# Patient Record
Sex: Male | Born: 1960 | Race: Black or African American | Hispanic: No | Marital: Single
Health system: Southern US, Community
[De-identification: ages and names within clinical notes are randomized; demographics above are authoritative.]

## PROBLEM LIST (undated history)

## (undated) DIAGNOSIS — I10 Essential (primary) hypertension: Secondary | ICD-10-CM

---

## 2017-02-17 ENCOUNTER — Encounter (HOSPITAL_COMMUNITY): Payer: Self-pay | Admitting: Emergency Medicine

## 2017-02-17 ENCOUNTER — Inpatient Hospital Stay (HOSPITAL_COMMUNITY)
Admission: EM | Admit: 2017-02-17 | Discharge: 2017-02-18 | DRG: 064 | Disposition: A | Discharge status: Other Acute Inpatient Hospital | Payer: Medicaid Other | Attending: Neurology | Admitting: Neurology

## 2017-02-17 ENCOUNTER — Emergency Department (HOSPITAL_COMMUNITY): Payer: Medicaid Other

## 2017-02-17 DIAGNOSIS — G8191 Hemiplegia, unspecified affecting right dominant side: Secondary | ICD-10-CM | POA: Diagnosis not present

## 2017-02-17 DIAGNOSIS — I610 Nontraumatic intracerebral hemorrhage in hemisphere, subcortical: Principal | ICD-10-CM | POA: Diagnosis present

## 2017-02-17 DIAGNOSIS — I161 Hypertensive emergency: Secondary | ICD-10-CM | POA: Diagnosis not present

## 2017-02-17 DIAGNOSIS — R4701 Aphasia: Secondary | ICD-10-CM | POA: Diagnosis not present

## 2017-02-17 DIAGNOSIS — N179 Acute kidney failure, unspecified: Secondary | ICD-10-CM | POA: Diagnosis not present

## 2017-02-17 DIAGNOSIS — G936 Cerebral edema: Secondary | ICD-10-CM | POA: Diagnosis present

## 2017-02-17 DIAGNOSIS — R4182 Altered mental status, unspecified: Secondary | ICD-10-CM | POA: Diagnosis present

## 2017-02-17 DIAGNOSIS — I10 Essential (primary) hypertension: Secondary | ICD-10-CM | POA: Diagnosis present

## 2017-02-17 DIAGNOSIS — I619 Nontraumatic intracerebral hemorrhage, unspecified: Secondary | ICD-10-CM | POA: Diagnosis present

## 2017-02-17 DIAGNOSIS — Z9114 Patient's other noncompliance with medication regimen: Secondary | ICD-10-CM

## 2017-02-17 DIAGNOSIS — I629 Nontraumatic intracranial hemorrhage, unspecified: Secondary | ICD-10-CM

## 2017-02-17 HISTORY — DX: Essential (primary) hypertension: I10

## 2017-02-17 LAB — PROTIME-INR
INR: 1.24
PROTHROMBIN TIME: 15.5 s — AB (ref 11.4–15.2)

## 2017-02-17 LAB — I-STAT TROPONIN, ED: Troponin i, poc: 0.06 ng/mL (ref 0.00–0.08)

## 2017-02-17 LAB — CBC
HCT: 20.9 % — ABNORMAL LOW (ref 39.0–52.0)
Hemoglobin: 7.2 g/dL — ABNORMAL LOW (ref 13.0–17.0)
MCH: 37.5 pg — ABNORMAL HIGH (ref 26.0–34.0)
MCHC: 34.4 g/dL (ref 30.0–36.0)
MCV: 108.9 fL — ABNORMAL HIGH (ref 78.0–100.0)
PLATELETS: 176 10*3/uL (ref 150–400)
RBC: 1.92 MIL/uL — AB (ref 4.22–5.81)
RDW: 20.7 % — ABNORMAL HIGH (ref 11.5–15.5)
WBC: 8.7 10*3/uL (ref 4.0–10.5)

## 2017-02-17 LAB — I-STAT CHEM 8, ED
BUN: 30 mg/dL — ABNORMAL HIGH (ref 6–20)
CHLORIDE: 112 mmol/L — AB (ref 101–111)
Calcium, Ion: 1.16 mmol/L (ref 1.15–1.40)
Creatinine, Ser: 3.4 mg/dL — ABNORMAL HIGH (ref 0.61–1.24)
GLUCOSE: 105 mg/dL — AB (ref 65–99)
HEMATOCRIT: 23 % — AB (ref 39.0–52.0)
Hemoglobin: 7.8 g/dL — ABNORMAL LOW (ref 13.0–17.0)
POTASSIUM: 4.7 mmol/L (ref 3.5–5.1)
Sodium: 137 mmol/L (ref 135–145)
TCO2: 16 mmol/L — ABNORMAL LOW (ref 22–32)

## 2017-02-17 LAB — COMPREHENSIVE METABOLIC PANEL
ALBUMIN: 3 g/dL — AB (ref 3.5–5.0)
ALK PHOS: 207 U/L — AB (ref 38–126)
ALT: 18 U/L (ref 17–63)
ANION GAP: 12 (ref 5–15)
AST: 56 U/L — ABNORMAL HIGH (ref 15–41)
BUN: 30 mg/dL — ABNORMAL HIGH (ref 6–20)
CALCIUM: 8.2 mg/dL — AB (ref 8.9–10.3)
CHLORIDE: 110 mmol/L (ref 101–111)
CO2: 13 mmol/L — AB (ref 22–32)
Creatinine, Ser: 3.24 mg/dL — ABNORMAL HIGH (ref 0.61–1.24)
GFR calc Af Amer: 24 mL/min — ABNORMAL LOW (ref >60)
GFR calc non Af Amer: 20 mL/min — ABNORMAL LOW (ref >60)
GLUCOSE: 107 mg/dL — AB (ref 65–99)
Potassium: 4.6 mmol/L (ref 3.5–5.1)
SODIUM: 135 mmol/L (ref 135–145)
Total Bilirubin: 0.8 mg/dL (ref 0.3–1.2)
Total Protein: 6.4 g/dL — ABNORMAL LOW (ref 6.5–8.1)

## 2017-02-17 LAB — DIFFERENTIAL
Basophils Absolute: 0 10*3/uL (ref 0.0–0.1)
Basophils Relative: 0 %
EOS PCT: 1 %
Eosinophils Absolute: 0.1 10*3/uL (ref 0.0–0.7)
LYMPHS PCT: 34 %
Lymphs Abs: 3 10*3/uL (ref 0.7–4.0)
Monocytes Absolute: 0.4 10*3/uL (ref 0.1–1.0)
Monocytes Relative: 4 %
NEUTROS ABS: 5.3 10*3/uL (ref 1.7–7.7)
NEUTROS PCT: 61 %

## 2017-02-17 LAB — TROPONIN I: TROPONIN I: 0.04 ng/mL — AB (ref <0.03)

## 2017-02-17 LAB — APTT: aPTT: 35 seconds (ref 24–36)

## 2017-02-17 LAB — CBG MONITORING, ED: GLUCOSE-CAPILLARY: 93 mg/dL (ref 65–99)

## 2017-02-17 MED ORDER — IOPAMIDOL (ISOVUE-370) INJECTION 76%
INTRAVENOUS | Status: AC
  Filled 2017-02-17: qty 50

## 2017-02-17 MED ORDER — ACETAMINOPHEN 160 MG/5ML PO SOLN: 650.0000 mg | ORAL | Status: DC | PRN

## 2017-02-17 MED ORDER — SENNOSIDES-DOCUSATE SODIUM 8.6-50 MG PO TABS
1.0000 | ORAL_TABLET | Freq: Two times a day (BID) | ORAL | Status: DC
  Filled 2017-02-17: qty 1

## 2017-02-17 MED ORDER — ACETAMINOPHEN 650 MG RE SUPP: 650.0000 mg | RECTAL | Status: DC | PRN

## 2017-02-17 MED ORDER — LABETALOL HCL 5 MG/ML IV SOLN
10.0000 mg | INTRAVENOUS | Status: AC
  Administered 2017-02-17 (×2): 10 mg via INTRAVENOUS

## 2017-02-17 MED ORDER — ACETAMINOPHEN 325 MG PO TABS: 650.0000 mg | ORAL_TABLET | ORAL | Status: DC | PRN

## 2017-02-17 MED ORDER — PANTOPRAZOLE SODIUM 40 MG IV SOLR
40.0000 mg | Freq: Every day | INTRAVENOUS | Status: DC
  Administered 2017-02-17: 40 mg via INTRAVENOUS
  Filled 2017-02-17: qty 40

## 2017-02-17 MED ORDER — STROKE: EARLY STAGES OF RECOVERY BOOK
Freq: Once | Status: DC
  Administered 2017-02-17: 20:00:00
  Filled 2017-02-17: qty 1

## 2017-02-17 MED ORDER — LABETALOL HCL 5 MG/ML IV SOLN
INTRAVENOUS | Status: AC
  Filled 2017-02-17: qty 4

## 2017-02-17 MED ORDER — NICARDIPINE HCL IN NACL 20-0.86 MG/200ML-% IV SOLN
0.0000 mg/h | INTRAVENOUS | Status: DC
  Administered 2017-02-17 (×2): 5 mg/h via INTRAVENOUS
  Filled 2017-02-17: qty 400
  Filled 2017-02-17: qty 200

## 2017-02-17 NOTE — ED Triage Notes (Signed)
Pt BIB EMS. LSN 1800 today when pt arrived at his sister's house. Per EMS, family noted pt becoming altered/"no himself" and he attempted to stand from the chair he was in, only to fall back immediately. Pt presents with flacid R sided weakness to arm and leg, R side gaze, limited verbal response, and mild confusion. Known hx of HTN, which family reports poor compliance with meds. Initial CBG of 69 for EMS, for which he received half an amp of D50. CBG rose to 141 en route.

## 2017-02-17 NOTE — Progress Notes (Signed)
Patient ID: Randy GaussCharles Kolden, male   DOB: 01/07/1961, 56 y.o.   MRN: [id number]  I was asked to review a CT of a patient with a dominant hemisphere basal ganglia hemorrhage for possible surgical evacuation.  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  Patient is currently awake, aphasic and hemiplegic.  Recommend maximizing medical treatment with strict BP management and possibly 3%NS.

## 2017-02-17 NOTE — ED Notes (Addendum)
Pt sister called. Unable to come to hospital d/t lack of transportation. States pt has medical records with [facility]. Individual able to confirm hx of HTN, doesn't take his BP meds often, and had a previous "small stroke a couple years ago". Individual states that the pt "drinks a lot" and smokes regularly. Unable to give additional information.

## 2017-02-17 NOTE — ED Provider Notes (Signed)
[facility]-EMERGENCY DEPT Provider Note   CSN: [id number] Arrival date & time: 02/17/17  1912     History   Chief Complaint No chief complaint on file.   HPI Randy Guerrero is a 56 y.o. male.  Patient is a 56yo M with only known PMH of HTN noncompliant on medications who presents with R sided weakness. History via EMS since patient is nonverbal. Level V caveat. Patient was last seen normal around 6pm today. Was reportedly at sister's house, tried to stand but fell backwards and noted to have R sided weakness. Brought in by EMS as code stroke with elevated BP to 200/130s     Per EMS, has h/o HTN noncompliant on medications. No other PMH known. No past medical history on file.   Patient Active Problem List   Diagnosis Date Noted  . ICH (intracerebral hemorrhage) (HCC) 02/17/2017    No past surgical history on file.     Home Medications    Prior to Admission medications   Not on File    Family History No family history on file.  Social History Social History  Substance Use Topics  . Smoking status: Not on file  . Smokeless tobacco: Not on file  . Alcohol use Not on file  Patient denies EtOH, tobacco use, recreational drugs   Allergies   Patient has no allergy information on record.   Review of Systems Review of Systems  Unable to perform ROS: Patient nonverbal (Level V caveat)     Physical Exam Updated Vital Signs BP (!) 177/113   Pulse 87   Resp 18   SpO2 91%   Physical Exam  Constitutional: He appears well-developed and well-nourished. No distress.  HENT:  Head: Normocephalic and atraumatic.  Nose: Nose normal.  Mouth/Throat: Oropharynx is clear and moist.  Eyes: Pupils are equal, round, and reactive to light. Conjunctivae are normal.  L sided gaze, unable to look towards R side  Neck: Neck supple.  Cardiovascular: Normal rate, regular rhythm, normal heart sounds and intact distal pulses.   No murmur heard. Pulmonary/Chest: Effort normal and  breath sounds normal. No respiratory distress. He has no wheezes.  Abdominal: Soft. Bowel sounds are normal. He exhibits no distension. There is no tenderness. There is no rebound and no guarding.  Musculoskeletal: He exhibits edema.  Lymphadenopathy:    He has no cervical adenopathy.  Neurological: He is alert.  Oriented to self and place. Unable to assess if oriented to time. R sided neglect. 0/5 strength on R UE and LE. DTRs 2+. Unable to assess whether sensation intact.   Skin: Capillary refill takes less than 2 seconds.     ED Treatments / Results  Labs (all labs ordered are listed, but only abnormal results are displayed) Labs Reviewed  CBC - Abnormal; Notable for the following:       Result Value   RBC 1.92 (*)    Hemoglobin 7.2 (*)    HCT 20.9 (*)    MCV 108.9 (*)    MCH 37.5 (*)    RDW 20.7 (*)    All other components within normal limits  I-STAT CHEM 8, ED - Abnormal; Notable for the following:    Chloride 112 (*)    BUN 30 (*)    Creatinine, Ser 3.40 (*)    Glucose, Bld 105 (*)    TCO2 16 (*)    Hemoglobin 7.8 (*)    HCT 23.0 (*)    All other components within normal limits  DIFFERENTIAL  PROTIME-INR  APTT  COMPREHENSIVE METABOLIC PANEL  HIV ANTIBODY (ROUTINE TESTING)  I-STAT TROPONIN, ED  CBG MONITORING, ED    EKG  EKG Interpretation None       Radiology Ct Head Code Stroke Wo Contrast  Result Date: 02/17/2017 CLINICAL DATA:  Code stroke.  56 y/o  M; right-sided weakness. EXAM: CT HEAD WITHOUT CONTRAST TECHNIQUE: Contiguous axial images were obtained from the base of the skull through the vertex without intravenous contrast. COMPARISON:  None. FINDINGS: Brain: Acute hemorrhage centered in the left lateral basal ganglia measuring 6.4 x 3.2 x 5.0 cm (volume = 54 cm^3) (AP x ML x CC series 3, image 17 and series 5, image 34). Surrounding hypoattenuation is compatible with vasogenic edema. Local mass effect results in sulcal effacement in the surrounding  cerebral hemispheres, left lateral ventricle effacement, and 5 mm of left-to-right midline shift. No downward herniation. Additionally there is increased density within the left caudate head which may represent petechial hemorrhage. Vascular: No hyperdense vessel or unexpected calcification. Skull: Normal. Negative for fracture or focal lesion. Sinuses/Orbits: Mild left maxillary sinus mucosal thickening and mucous retention cyst. Otherwise negative. Other: None. ASPECTS [facility] Stroke Program Early CT Score) - Ganglionic level infarction (caudate, lentiform nuclei, internal capsule, insula, M1-M3 cortex): 7 - Supraganglionic infarction (M4-M6 cortex): 3 Total score (0-10 with 10 being normal): 10 IMPRESSION: 1. Large acute hemorrhage in the left lateral basal ganglia, 54 cc. 2. Mass effect with effacement of left lateral ventricle and 5 mm left-to-right midline shift. No downward herniation. 3. Possible petechial hemorrhage in left caudate head. 4. ASPECTS is 10 These results were called by telephone at the time of interpretation on 02/17/2017 at 7:32 pm to Dr. Gerhard Munch , who verbally acknowledged these results. Electronically Signed   By: Mitzi Hansen M.D.   On: 02/17/2017 19:33    Procedures Procedures (including critical care time)  Medications Ordered in ED Medications  labetalol (NORMODYNE,TRANDATE) injection 10 mg (not administered)  iopamidol (ISOVUE-370) 76 % injection (not administered)  labetalol (NORMODYNE,TRANDATE) 5 MG/ML injection (not administered)   stroke: mapping our early stages of recovery book (not administered)  acetaminophen (TYLENOL) tablet 650 mg (not administered)    Or  acetaminophen (TYLENOL) solution 650 mg (not administered)    Or  acetaminophen (TYLENOL) suppository 650 mg (not administered)  senna-docusate (Senokot-S) tablet 1 tablet (not administered)  pantoprazole (PROTONIX) injection 40 mg (not administered)  nicardipine (CARDENE) 20mg  in  0.86% saline IV infusion (0.1 mg/ml) (not administered)     Initial Impression / Assessment and Plan / ED Course  I have reviewed the triage vital signs and the nursing notes.  Pertinent labs & imaging results that were available during my care of the patient were reviewed by me and considered in my medical decision making (see chart for details).   Patient is a 56yo M with only known PMH of HTN noncompliant on home medications, no further history obtained d/t patient nonverbal status and no family at bedside. He presented to the ED as code stroke after being found to have R sided weakness with last known normal around 6pm today. Found to have a large acute hemorrhage in L lateral basal ganglia that correlates with R sided neglect on physical exam. Has elevated BP that is being managed by neurology with a nicardipine gtt. Does have an elevated creatinine with unknown baseline. Patient is admitted to ICU under neurology Dr. Laurence Slate service.  Final Clinical Impressions(s) / ED Diagnoses   Final diagnoses:  Intracranial hemorrhage [facility])    New Prescriptions New Prescriptions   No medications on file     Leland Her, DO 02/17/17 1952    Gerhard Munch, MD 02/17/17 2351

## 2017-02-17 NOTE — Progress Notes (Signed)
Pt arrived on unit at 2300. Report taken from Rushie Goltz RN.

## 2017-02-18 ENCOUNTER — Inpatient Hospital Stay (HOSPITAL_COMMUNITY): Payer: Medicaid Other

## 2017-02-18 DIAGNOSIS — I629 Nontraumatic intracranial hemorrhage, unspecified: Secondary | ICD-10-CM

## 2017-02-18 LAB — HIV ANTIBODY (ROUTINE TESTING W REFLEX): HIV SCREEN 4TH GENERATION: NONREACTIVE

## 2017-02-18 LAB — MRSA PCR SCREENING: MRSA by PCR: NEGATIVE

## 2017-02-18 NOTE — Progress Notes (Signed)
[facility]. [facility]side [facility] and pt officially left unit at [employer]

## 2017-02-18 NOTE — Discharge Summary (Signed)
Date of admission: 02/17/2017 Date of discharge: 02/17/2017   HPI:  Randy Guerrero is a 56 y.o. male  the past medical history of hypertension, prior CVA who presents with sudden onset aphasia and right hemiparesis around 6 PM was witnessed by his sister. [facility] room as a stroke alert, blood pressure was 200 systolic on arrival. According to family the patient is noncompliant with hypertensive medications. He is not on any antiplatelets or anticoagulants. CT head showed a large left basal ganglia hemorrhage.   Hospital Course:   Patient was admitted to the neurological ICU. He was started on Cardene drip and blood pressures have remained less than 140/90. The patient remained stable neurologically. Consulted neurosurgery who felt there was no role for neurosurgical intervention in the dominant hemisphere intracerebral hemorrhage, even if the patient did decline clinically. The patient does have greater than 40 mL subcortical hemorrhage mass effect and midline shift and may be a candidate for microinvasive evacuation procedures with some preliminary studies supporting early evacuation. I discussed this case with neurosurgery attending  at [facility] who accepted the patient for possible neurosurgical intervention. I spoke with the family ( both sisters) who agreed with the transfer.  Left basal ganglia hemorrhage  Cytotoxic cerebral edema Midline shift 5 mm Elevated creatinine on arrival, unknown if patient has CKD Hypertensive emergency with chronic uncontrolled hypertension   #Left basal ganglia hemorrhage IC score 2 50 cc associated with cytotoxic edema and midline shift. No herniation seen on initial CT. Started patient on Cardene drip BP goal of less than 140 systolic Platelets, PT/INR and APTT within normal limits. CTA head and was not performed due to a kidney disease and location of hemorrhage favors hypertensive etiology Repeat CT head in 4  hours Low suspicion to intubate decline in mental status, GCS is currently 11 Neurosurgery was consulted, felt patient was not a candidate for any neurosurgical intervention even if he declines.  #AKI, possibly with CKD Avoid nephrotoxic agents  #Hypertensive emergency Continue Micardis pain drip with goal of less than 140 systolic    CLINICAL DATA:  Follow-up stroke.  History of hypertension.  EXAM: CT HEAD WITHOUT CONTRAST  TECHNIQUE: Contiguous axial images were obtained from the base of the skull through the vertex without intravenous contrast.  COMPARISON:  CT HEAD February 17, 2017  FINDINGS: BRAIN: Similar large LEFT basal ganglia/subinsular 6.5 x 2.9 cm intraparenchymal hematoma involving LEFT temporal stem. Similar surrounding low-density vasogenic edema. 5 mm LEFT-to-RIGHT midline shift. Partially effaced LEFT lateral ventricle without entrapment. No acute large vascular territory infarct. Patchy white matter changes most compatible chronic small vessel ischemic disease exclusive of the aforementioned abnormality. No abnormal extra-axial fluid collections.  VASCULAR: Trace calcific atherosclerosis.  SKULL/SOFT TISSUES: No skull fracture. No significant soft tissue swelling.  ORBITS/SINUSES: The included ocular globes and orbital contents are normal.Mild LEFT maxillary sinus mucosal thickening. subcentimeter RIGHT anterior ethmoid osteoma. Mastoid air cells are well aerated.  OTHER: None.  IMPRESSION: 1. Evolving large LEFT basal ganglia/subinsular hematoma, similar 5 mm LEFT-to-RIGHT midline shift. No ventricular entrapment. 2. Mild to moderate white matter changes compatible chronic small vessel ischemic disease.  CLINICAL DATA:  Code stroke.  56 y/o  M; right-sided weakness.  EXAM: CT HEAD WITHOUT CONTRAST  TECHNIQUE: Contiguous axial images were obtained from the base of the skull through the vertex without intravenous  contrast.  COMPARISON:  None.  FINDINGS: Brain: Acute hemorrhage centered in the left lateral basal ganglia measuring 6.4 x 3.2  x 5.0 cm (volume = 54 cm^3) (AP x ML x CC series 3, image 17 and series 5, image 34). Surrounding hypoattenuation is compatible with vasogenic edema. Local mass effect results in sulcal effacement in the surrounding cerebral hemispheres, left lateral ventricle effacement, and 5 mm of left-to-right midline shift. No downward herniation. Additionally there is increased density within the left caudate head which may represent petechial hemorrhage.  Vascular: No hyperdense vessel or unexpected calcification.  Skull: Normal. Negative for fracture or focal lesion.  Sinuses/Orbits: Mild left maxillary sinus mucosal thickening and mucous retention cyst. Otherwise negative.  Other: None.  ASPECTS [facility] Stroke Program Early CT Score)  - Ganglionic level infarction (caudate, lentiform nuclei, internal capsule, insula, M1-M3 cortex): 7  - Supraganglionic infarction (M4-M6 cortex): 3  Total score (0-10 with 10 being normal): 10  IMPRESSION: 1. Large acute hemorrhage in the left lateral basal ganglia, 54 cc. 2. Mass effect with effacement of left lateral ventricle and 5 mm left-to-right midline shift. No downward herniation. 3. Possible petechial hemorrhage in left caudate head. 4. ASPECTS is 10 These results were called by telephone at the time of interpretation on 02/17/2017 at 7:32 pm to Dr. Gerhard Munch , who verbally acknowledged these results.

## 2017-02-18 NOTE — H&P (Addendum)
Neurology H&P  CC: Right hemiparesis and aphasia  History is obtained from: EMS and family  HPI: Randy Guerrero is a 56 y.o. male  the past medical history of hypertension, prior CVA who presents with sudden onset aphasia and right hemiparesis around 6 PM was witnessed by his sister. He presented to [facility] emergency room as a stroke alert, blood pressure was 200 systolic on arrival. According to family the patient is noncompliant with hypertensive medications. He is not on any antiplatelets or anticoagulants.   LKW: 6pm  ICH Score: 2 Modified Rankin Score at baseline 0  ZOX:[id number] to obtain due to altered mental status.   Past Medical History:  Diagnosis Date  . Hypertension      No family history on file.   Social History:  has no tobacco, alcohol, and drug history on file.   Exam: Current vital signs: BP 130/82   Pulse 94   Temp 98.4 F (36.9 C) (Oral)   Resp 20   Ht 5\' 6"  (1.676 m)   Wt 67.5 kg (148 lb 13 oz)   SpO2 98%   BMI 24.02 kg/m  Vital signs in last 24 hours: Temp:  [98.4 F (36.9 C)] 98.4 F (36.9 C) (08/30 0000) Pulse Rate:  [86-94] 94 (08/29 2305) Resp:  [16-25] 20 (08/29 2305) BP: (125-199)/(78-130) 130/82 (08/29 2305) SpO2:  [91 %-98 %] 98 % (08/29 2305) FiO2 (%):  [0 %] 0 % (08/29 1922) Weight:  [67.5 kg (148 lb 13 oz)-67.6 kg (149 lb)] 67.5 kg (148 lb 13 oz) (08/30 0015)  Physical Exam  Constitutional: Appears well-developed and well-nourished.  Psych: Affect appropriate to situation Eyes: No scleral injection HENT: No OP obstrucion Head: Normocephalic.  Cardiovascular: Normal rate and regular rhythm.  Respiratory: Effort normal and breath sounds normal to anterior ascultation GI: Soft.  No distension. There is no tenderness.  Skin: WDI  Neuro: Mental Status: Patient Is alert, follows some commands intermittently. Aphasic Cranial Nerves: II: Visual Fields are full. Pupils are equal, round, and reactive to light.  Left  hemi-neglect. III,IV, VI: EOMI without ptosis or diploplia. No forced gaze deviation V: Facial sensation is symmetric to temperature VII: Facial movement is symmetric.  VIII: hearing is intact to voice X: Uvula elevates symmetrically XI: Shoulder shrug is symmetric. XII: tongue is midline without atrophy or fasciculations.  Motor: Tone is normal. Bulk is normal. 5/5 strength was present over left upper and lower extremity. 1/5 strength over right upper and lower extremity Sensory: Unable to clearly assess but less withdrawal to noxious stimulus over right hemibody Deep Tendon Reflexes: 2+ and symmetric in the biceps and patellae.  Plantars: Toes are downgoing bilaterally.  Cerebellar: No no obvious ataxia noted   Assessment plan  Randy Guerrero is a 56 y.o. male  the past medical history of hypertension, prior CVA who presents with sudden onset aphasia and right hemiparesis around 6 PM was witnessed by his sister. He presented to [facility] emergency room as a stroke alert, blood pressure was 200 systolic on arrival. CT head showed a large left basal ganglia hemorrhage    Left basal ganglia hemorrhage  Vasogenic cerebral edema Midline shift 5 mm Elevated creatinine on arrival, unknown if patient has CKD Hypertensive emergency with chronic uncontrolled hypertension   #Left basal ganglia hemorrhage IC score 2 50 cc associated with vaosogenic edema and midline shift. No herniation seen on initial CT. Started patient on Cardene drip BP goal of less than 140 systolic Platelets, PT/INR and APTT  within normal limits. CTA head and was not performed due to a kidney disease and location of hemorrhage favors hypertensive etiology Repeat CT head in 4 hours Low suspicion to intubate decline in mental status, GCS is currently 11 Neurosurgery was consulted, felt patient was not a candidate for any neurosurgical intervention even if he declines.  #AKI, possibly with CKD Avoid nephrotoxic  agents  #Hypertensive emergency Continue Micardis pain drip with goal of less than 140 systolic  DVT prophylaxis SCDs NPO     Sushanth Aroor MD [facility] [phone]  If 7pm to 7am, please call on call as listed on AMION.

## 2018-09-29 IMAGING — CT CT HEAD CODE STROKE
4 series · 16 of 47 positions shown, 18 images · non-contrast
Comparison: None.

CLINICAL DATA: Code stroke.  55 y/o  M; right-sided weakness.

EXAM:
CT HEAD WITHOUT CONTRAST
TECHNIQUE: Contiguous axial images were obtained from the base of the skull
through the vertex without intravenous contrast.

[Series 3: head wo · axial · 0.40mm/px · z∈[-56,+60]mm · 7 of 31 slices shown, 9 images]
[im 4/31  brain]
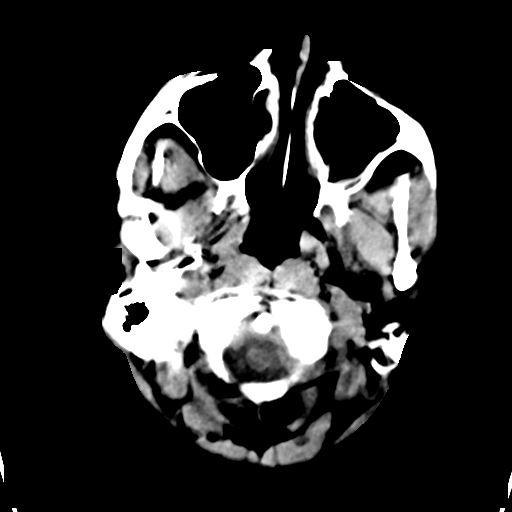
[im 4/31  bone]
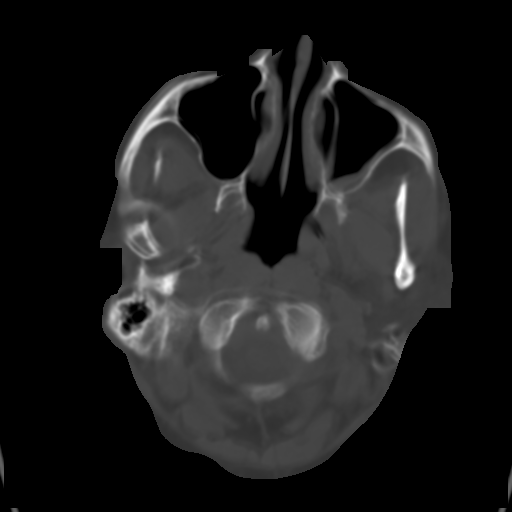
[im 8/31  brain]
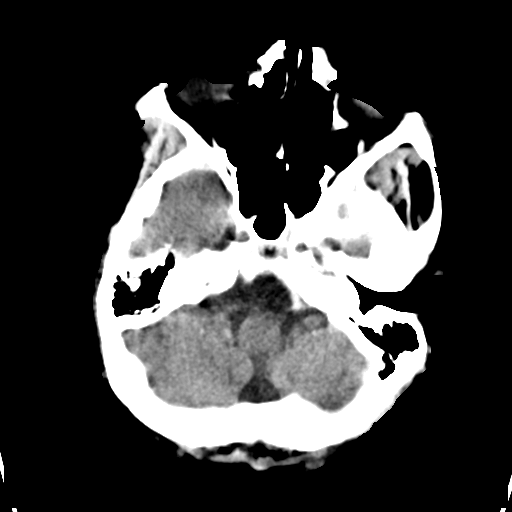
[im 12/31  brain]
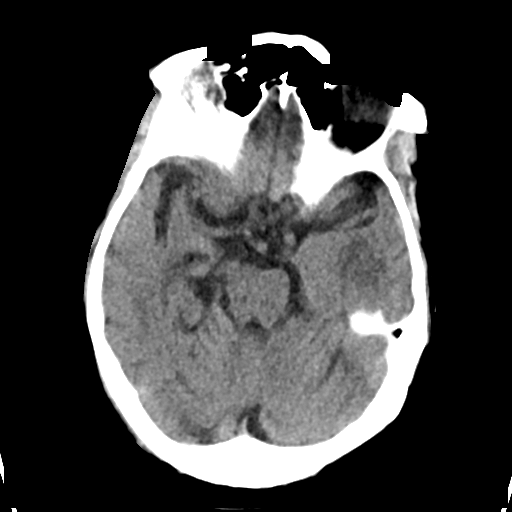
[im 16/31  brain]
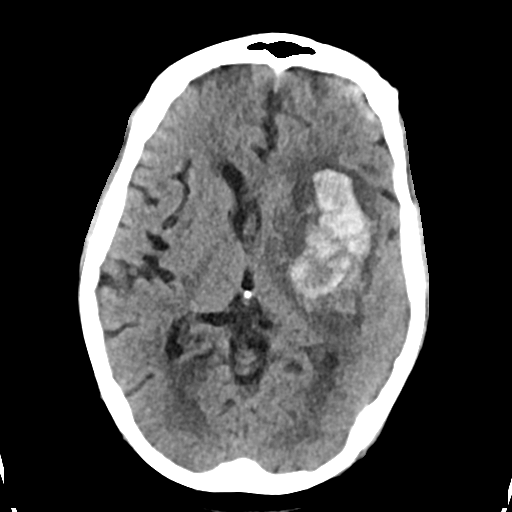
[im 19/31  brain]
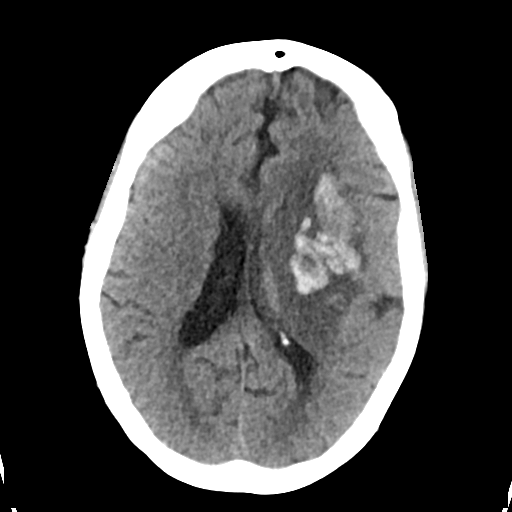
[im 19/31  bone]
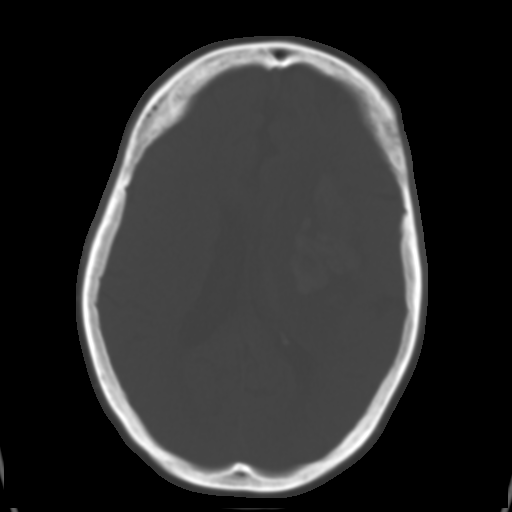
[im 23/31  brain]
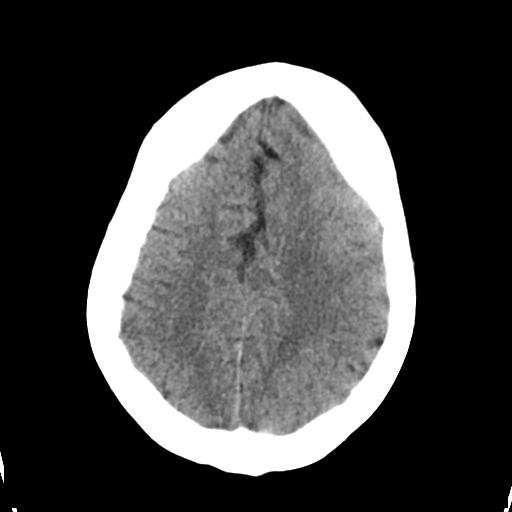
[im 27/31  brain]
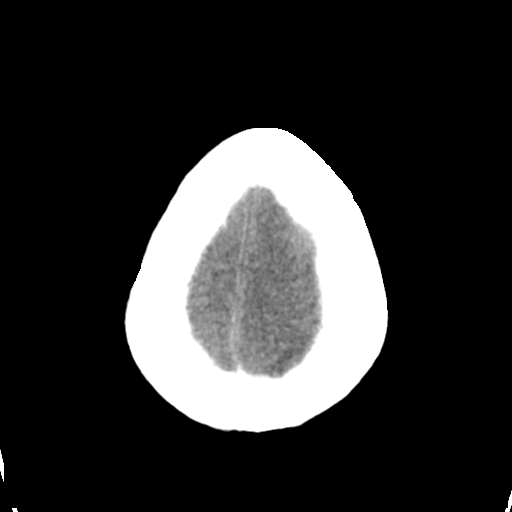

[Series 4: head bone · axial · 0.40mm/px · z∈[-56,-24]mm · 3 of 78 slices shown]
[im 8/78  bone]
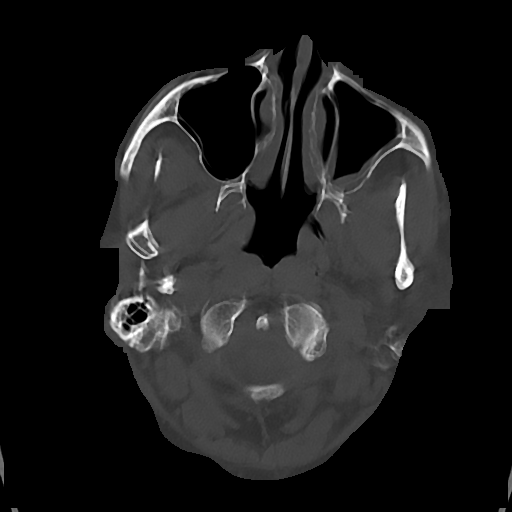
[im 16/78  bone]
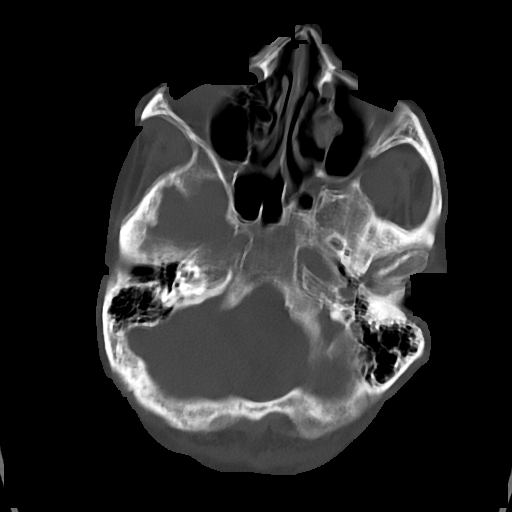
[im 24/78  bone]
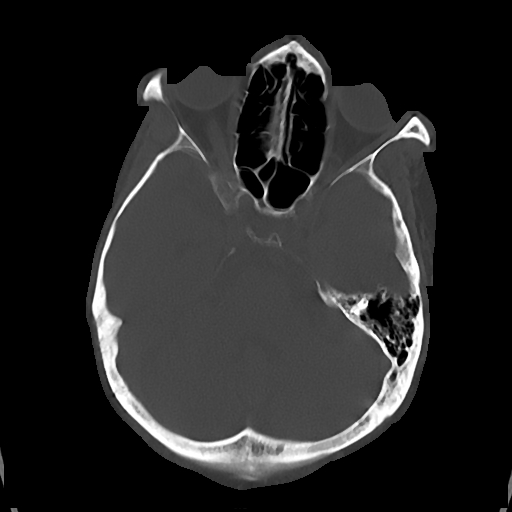

[Series 5: cor soft · coronal · 0.30mm/px · 3 of 67 slices shown]
[im 23/67  brain]
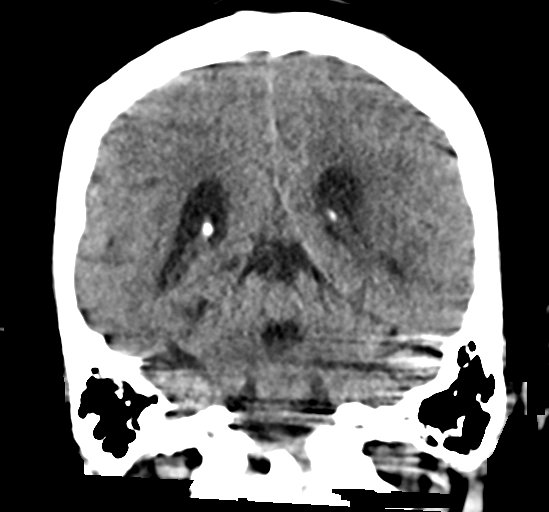
[im 30/67  brain]
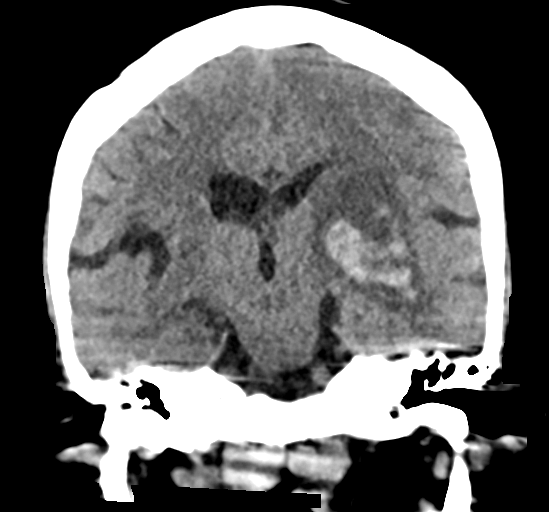
[im 37/67  brain]
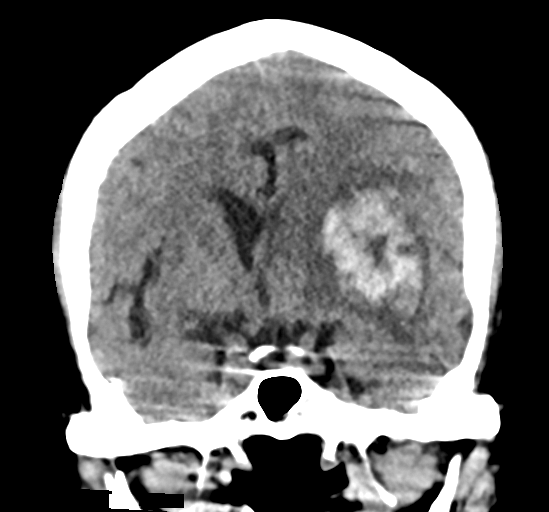

[Series 6: sag soft · sagittal · 0.30mm/px · 3 of 54 slices shown]
[im 18/54  brain]
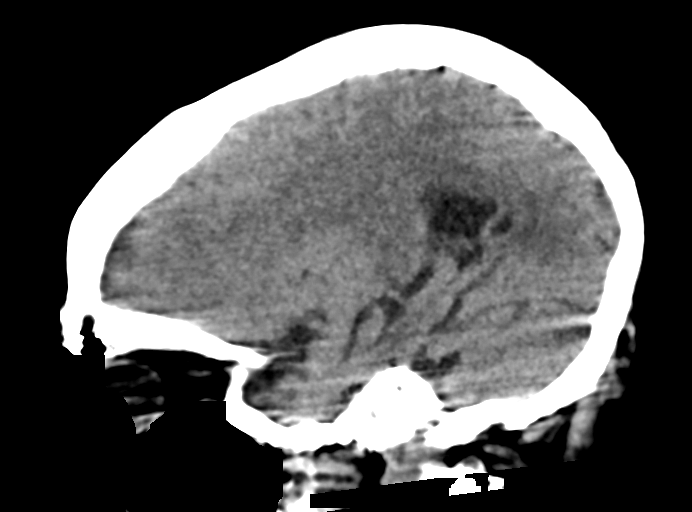
[im 27/54  brain]
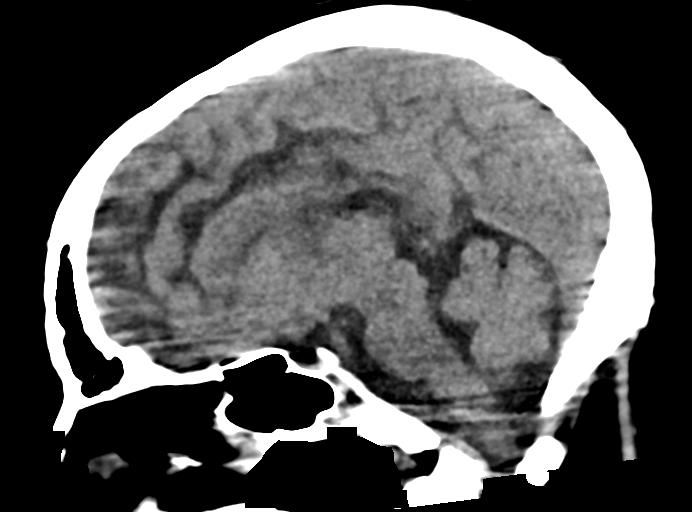
[im 36/54  brain]
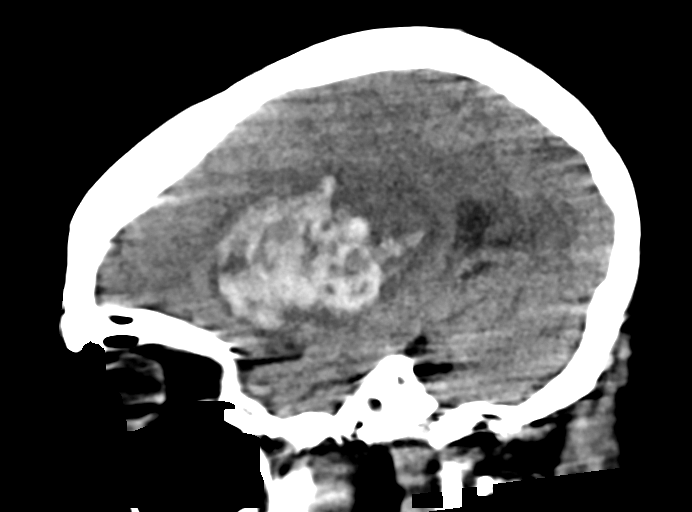

[16 of 47 positions shown; findings below may reference images not displayed]

FINDINGS: Brain: Acute hemorrhage centered in the left lateral basal ganglia
measuring 6.4 x 3.2 x 5.0 cm (volume = 54 cm^3) (AP x ML x CC
series 3, image 17 and series 5, image 34). Surrounding
hypoattenuation is compatible with vasogenic edema. Local mass
effect results in sulcal effacement in the surrounding cerebral
hemispheres, left lateral ventricle effacement, and 5 mm of
left-to-right midline shift. No downward herniation. Additionally
there is increased density within the left caudate head which may
represent petechial hemorrhage.

Vascular: No hyperdense vessel or unexpected calcification.

Skull: Normal. Negative for fracture or focal lesion.

Sinuses/Orbits: Mild left maxillary sinus mucosal thickening and
mucous retention cyst. Otherwise negative.

Other: None.

ASPECTS ([facility] Stroke Program Early CT Score)

- Ganglionic level infarction (caudate, lentiform nuclei, internal
capsule, insula, M1-M3 cortex): 7

- Supraganglionic infarction (M4-M6 cortex): 3

Total score (0-10 with 10 being normal): 10
IMPRESSION: 1. Large acute hemorrhage in the left lateral basal ganglia, 54 cc.
2. Mass effect with effacement of left lateral ventricle and 5 mm
left-to-right midline shift. No downward herniation.
3. Possible petechial hemorrhage in left caudate head.
4. ASPECTS is 10
These results were called by telephone at the time of interpretation
on 02/17/2017 at [DATE] to Dr. FRESIA MONTESINOS , who verbally
acknowledged these results.

By: Kayy Turgoose M.D.

## 2018-09-30 IMAGING — CT CT HEAD W/O CM
4 series · 15 of 47 positions shown, 17 images · non-contrast
Comparison: CT HEAD February 17, 2017

CLINICAL DATA: Follow-up stroke.  History of hypertension.

EXAM:
CT HEAD WITHOUT CONTRAST
TECHNIQUE: Contiguous axial images were obtained from the base of the skull
through the vertex without intravenous contrast.

[Series 3: head wo · axial · 0.44mm/px · z∈[-74,+40]mm · 7 of 31 slices shown, 9 images]
[im 4/31  brain]
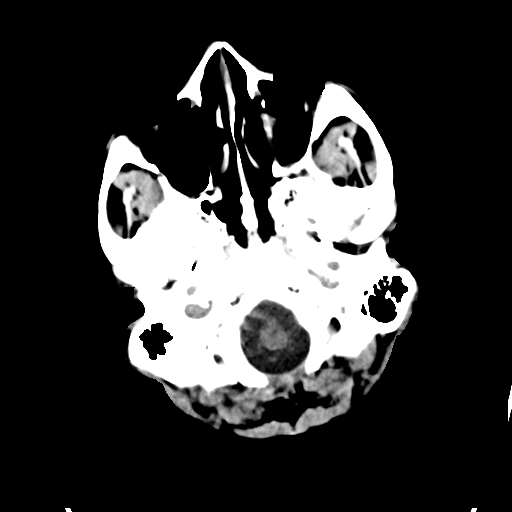
[im 4/31  bone]
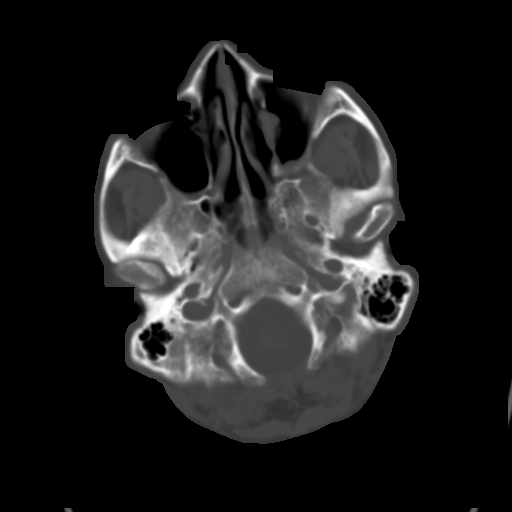
[im 8/31  brain]
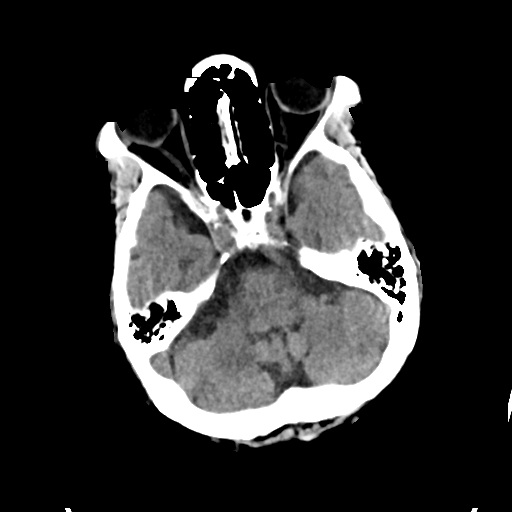
[im 12/31  brain]
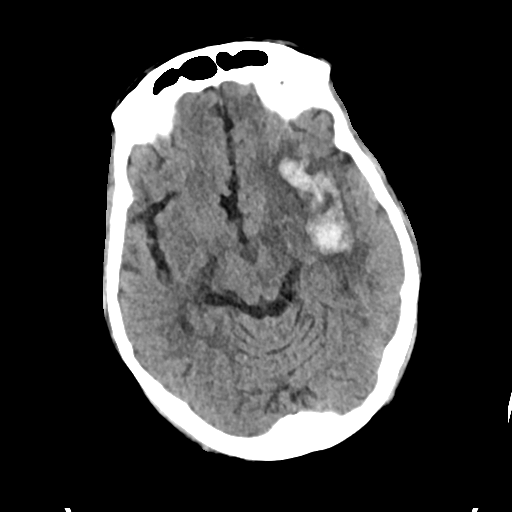
[im 16/31  brain]
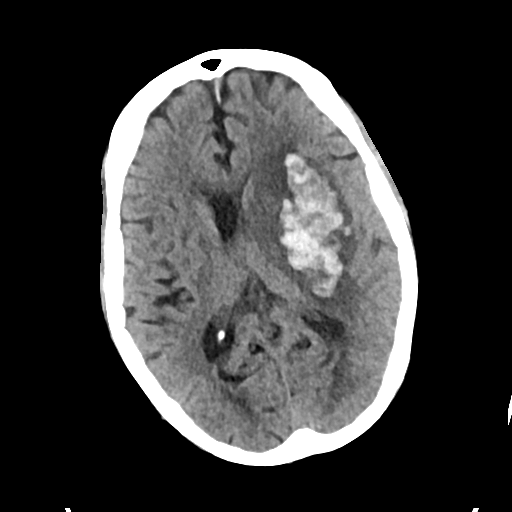
[im 19/31  brain]
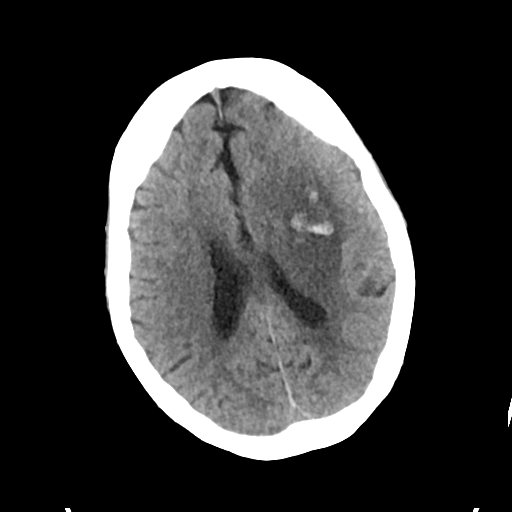
[im 19/31  bone]
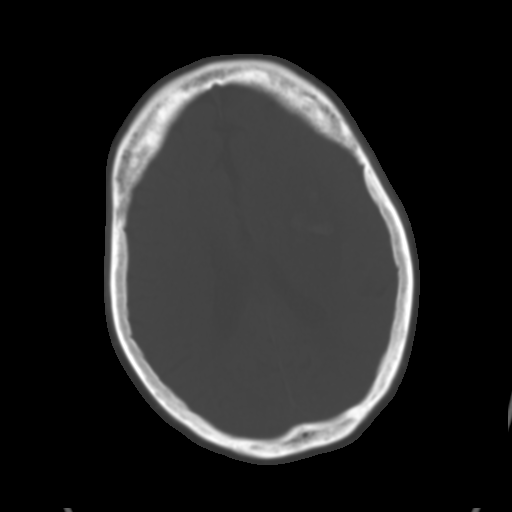
[im 23/31  brain]
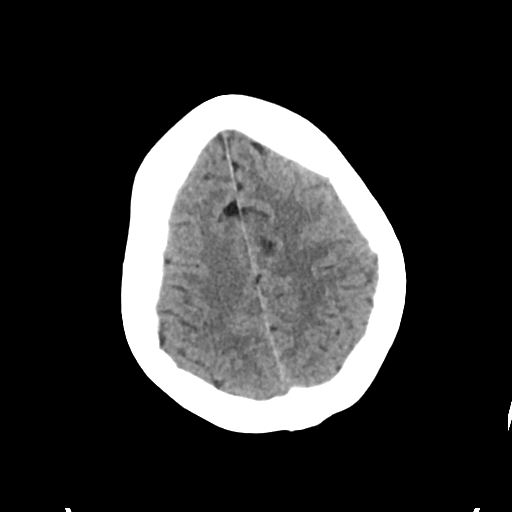
[im 27/31  brain]
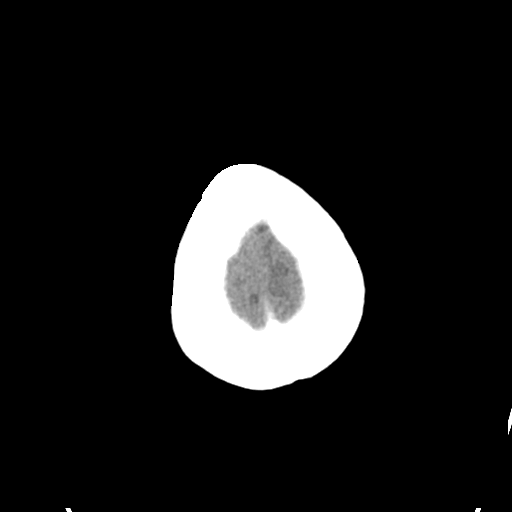

[Series 4: head bone · axial · 0.44mm/px · z∈[-76,-60]mm · 2 of 77 slices shown]
[im 8/77  bone]
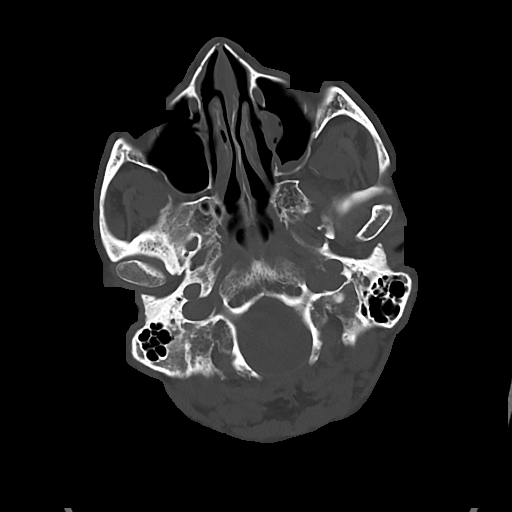
[im 16/77  bone]
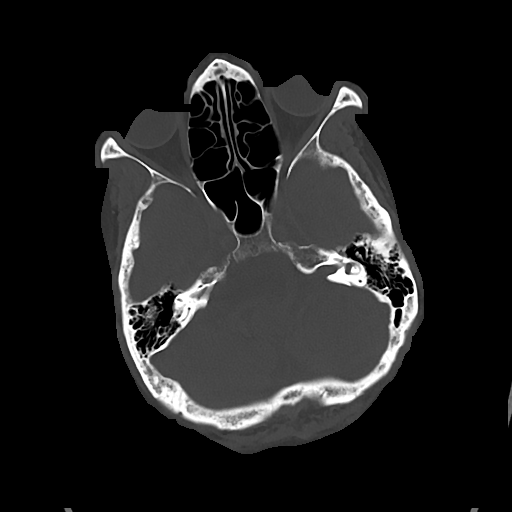

[Series 5: cor soft · coronal · 0.30mm/px · 3 of 70 slices shown]
[im 24/70  brain]
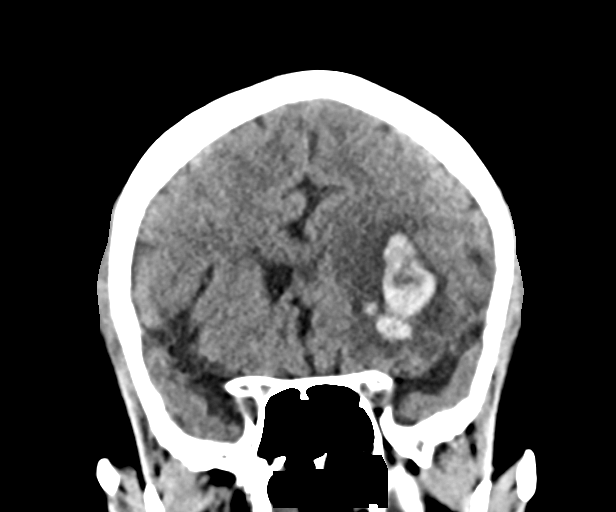
[im 31/70  brain]
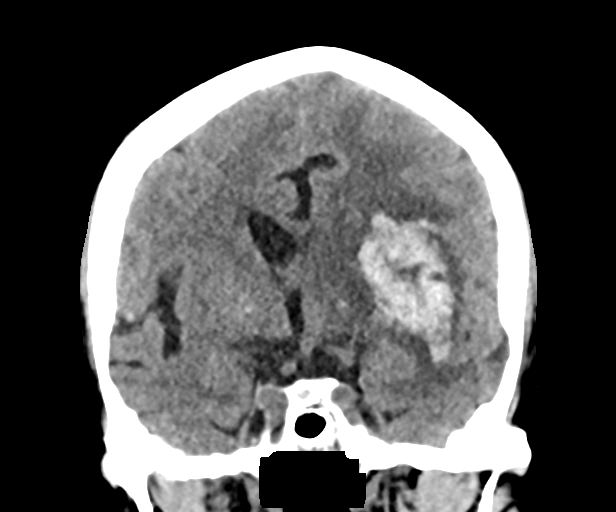
[im 39/70  brain]
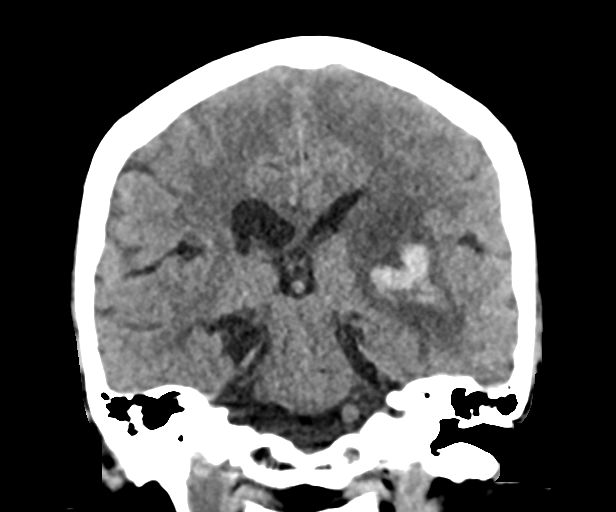

[Series 6: sag soft · sagittal · 0.30mm/px · 3 of 58 slices shown]
[im 20/58  brain]
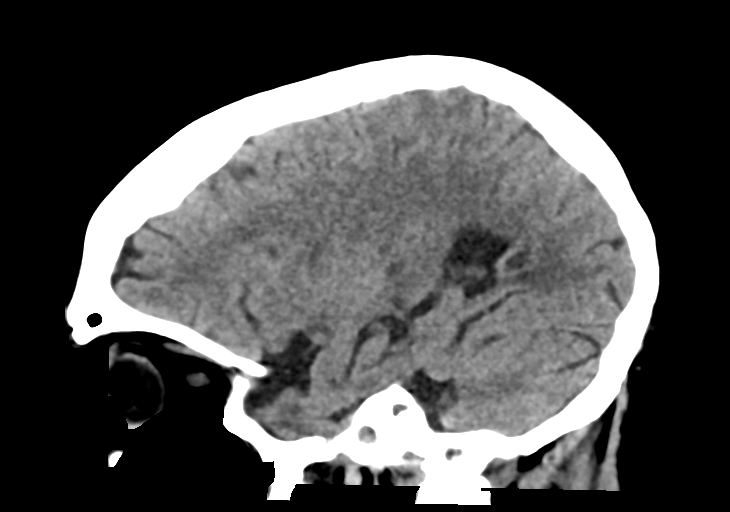
[im 29/58  brain]
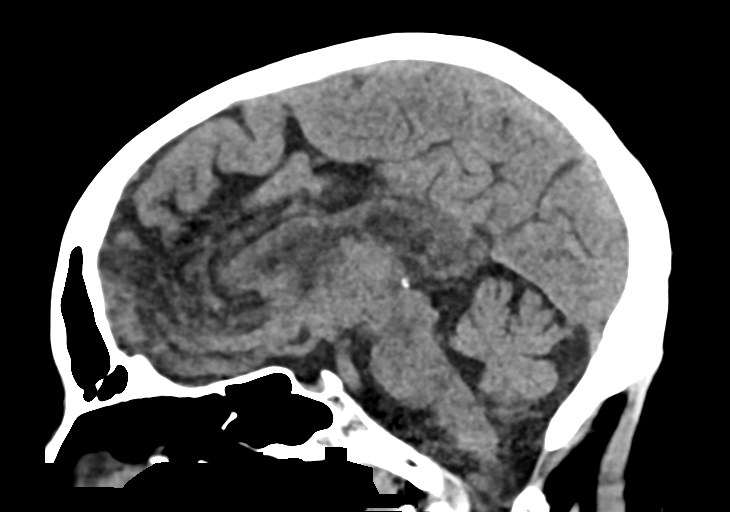
[im 39/58  brain]
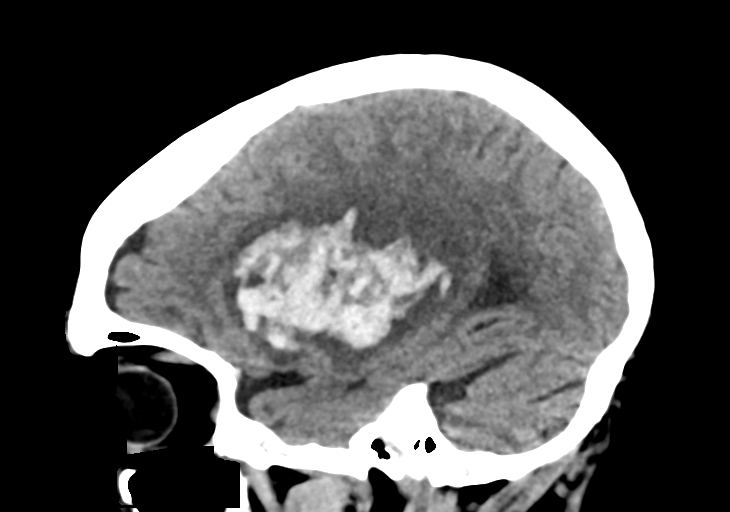

[15 of 47 positions shown; findings below may reference images not displayed]

FINDINGS: BRAIN: Similar large LEFT basal ganglia/subinsular 6.5 x 2.9 cm
intraparenchymal hematoma involving LEFT temporal stem. Similar
surrounding low-density vasogenic edema. 5 mm LEFT-to-RIGHT midline
shift. Partially effaced LEFT lateral ventricle without entrapment.
No acute large vascular territory infarct. Patchy white matter
changes most compatible chronic small vessel ischemic disease
exclusive of the aforementioned abnormality. No abnormal extra-axial
fluid collections.

VASCULAR: Trace calcific atherosclerosis.

SKULL/SOFT TISSUES: No skull fracture. No significant soft tissue
swelling.

ORBITS/SINUSES: The included ocular globes and orbital contents are
normal.Mild LEFT maxillary sinus mucosal thickening. subcentimeter
RIGHT anterior ethmoid osteoma. Mastoid air cells are well aerated.

OTHER: None.
IMPRESSION: 1. Evolving large LEFT basal ganglia/subinsular hematoma, similar 5
mm LEFT-to-RIGHT midline shift. No ventricular entrapment.
2. Mild to moderate white matter changes compatible chronic small
vessel ischemic disease.

## 2019-09-21 DEATH — deceased
